# Patient Record
Sex: Male | Born: 1970 | Race: White | Hispanic: No | Smoking: Never smoker
Health system: Southern US, Community
[De-identification: ages and names within clinical notes are randomized; demographics above are authoritative.]

## PROBLEM LIST (undated history)

## (undated) DIAGNOSIS — N200 Calculus of kidney: Secondary | ICD-10-CM

## (undated) DIAGNOSIS — Z9889 Other specified postprocedural states: Secondary | ICD-10-CM

## (undated) HISTORY — PX: CHOLECYSTECTOMY: SHX55

## (undated) HISTORY — DX: Other specified postprocedural states: Z98.890

## (undated) HISTORY — PX: LITHOTRIPSY: SUR834

---

## 2004-05-09 ENCOUNTER — Emergency Department (HOSPITAL_COMMUNITY): Admission: EM | Admit: 2004-05-09 | Discharge: 2004-05-09 | Payer: Self-pay | Admitting: Emergency Medicine

## 2007-11-24 ENCOUNTER — Encounter: Admission: RE | Admit: 2007-11-24 | Discharge: 2007-11-24 | Payer: Self-pay | Admitting: Family Medicine

## 2007-11-25 ENCOUNTER — Emergency Department (HOSPITAL_COMMUNITY): Admission: EM | Admit: 2007-11-25 | Discharge: 2007-11-25 | Payer: Self-pay | Admitting: Emergency Medicine

## 2010-07-03 ENCOUNTER — Emergency Department (HOSPITAL_BASED_OUTPATIENT_CLINIC_OR_DEPARTMENT_OTHER)
Admission: EM | Admit: 2010-07-03 | Discharge: 2010-07-03 | Payer: Self-pay | Source: Home / Self Care | Admitting: Emergency Medicine

## 2010-07-04 ENCOUNTER — Emergency Department (HOSPITAL_BASED_OUTPATIENT_CLINIC_OR_DEPARTMENT_OTHER)
Admission: EM | Admit: 2010-07-04 | Discharge: 2010-07-04 | Payer: Self-pay | Source: Home / Self Care | Admitting: Emergency Medicine

## 2010-10-08 LAB — BASIC METABOLIC PANEL
BUN: 12 mg/dL (ref 6–23)
BUN: 14 mg/dL (ref 6–23)
CO2: 24 mEq/L (ref 19–32)
CO2: 25 mEq/L (ref 19–32)
Calcium: 9 mg/dL (ref 8.4–10.5)
Calcium: 9.3 mg/dL (ref 8.4–10.5)
Chloride: 105 mEq/L (ref 96–112)
Chloride: 107 mEq/L (ref 96–112)
Creatinine, Ser: 0.9 mg/dL (ref 0.4–1.5)
Creatinine, Ser: 1.1 mg/dL (ref 0.4–1.5)
GFR calc Af Amer: >60 mL/min (ref >60)
GFR calc Af Amer: >60 mL/min (ref >60)
GFR calc non Af Amer: >60 mL/min (ref >60)
GFR calc non Af Amer: >60 mL/min (ref >60)
Glucose, Bld: 117 mg/dL — ABNORMAL HIGH (ref 70–99)
Glucose, Bld: 146 mg/dL — ABNORMAL HIGH (ref 70–99)
Potassium: 3.7 mEq/L (ref 3.5–5.1)
Potassium: 3.9 mEq/L (ref 3.5–5.1)
Sodium: 144 mEq/L (ref 135–145)
Sodium: 146 mEq/L — ABNORMAL HIGH (ref 135–145)

## 2010-10-08 LAB — URINE MICROSCOPIC-ADD ON

## 2010-10-08 LAB — URINALYSIS, ROUTINE W REFLEX MICROSCOPIC
Bilirubin Urine: NEGATIVE
Bilirubin Urine: NEGATIVE
Glucose, UA: NEGATIVE mg/dL
Glucose, UA: NEGATIVE mg/dL
Ketones, ur: 15 mg/dL — AB
Ketones, ur: NEGATIVE mg/dL
Leukocytes, UA: NEGATIVE
Nitrite: NEGATIVE
Nitrite: NEGATIVE
Protein, ur: 30 mg/dL — AB
Protein, ur: 30 mg/dL — AB
Specific Gravity, Urine: 1.027 (ref 1.005–1.030)
Specific Gravity, Urine: 1.028 (ref 1.005–1.030)
Urobilinogen, UA: 0.2 mg/dL (ref 0.0–1.0)
Urobilinogen, UA: 0.2 mg/dL (ref 0.0–1.0)
pH: 5.5 (ref 5.0–8.0)
pH: 6 (ref 5.0–8.0)

## 2010-10-08 LAB — CBC
HCT: 44.2 % (ref 39.0–52.0)
HCT: 45.1 % (ref 39.0–52.0)
Hemoglobin: 15.7 g/dL (ref 13.0–17.0)
Hemoglobin: 15.9 g/dL (ref 13.0–17.0)
MCH: 31 pg (ref 26.0–34.0)
MCH: 31.3 pg (ref 26.0–34.0)
MCHC: 34.7 g/dL (ref 30.0–36.0)
MCHC: 36 g/dL (ref 30.0–36.0)
MCV: 86.1 fL (ref 78.0–100.0)
MCV: 90.1 fL (ref 78.0–100.0)
Platelets: 296 10*3/uL (ref 150–400)
Platelets: 320 10*3/uL (ref 150–400)
RBC: 5 MIL/uL (ref 4.22–5.81)
RBC: 5.14 MIL/uL (ref 4.22–5.81)
RDW: 11.7 % (ref 11.5–15.5)
RDW: 11.8 % (ref 11.5–15.5)
WBC: 10.7 10*3/uL — ABNORMAL HIGH (ref 4.0–10.5)
WBC: 9.4 10*3/uL (ref 4.0–10.5)

## 2010-10-08 LAB — DIFFERENTIAL
Basophils Absolute: 0.1 10*3/uL (ref 0.0–0.1)
Basophils Relative: 1 % (ref 0–1)
Eosinophils Absolute: 0.1 10*3/uL (ref 0.0–0.7)
Eosinophils Relative: 1 % (ref 0–5)
Lymphocytes Relative: 33 % (ref 12–46)
Lymphs Abs: 3.5 10*3/uL (ref 0.7–4.0)
Monocytes Absolute: 1 10*3/uL (ref 0.1–1.0)
Monocytes Relative: 9 % (ref 3–12)
Neutro Abs: 6 10*3/uL (ref 1.7–7.7)
Neutrophils Relative %: 57 % (ref 43–77)

## 2011-04-22 LAB — COMPREHENSIVE METABOLIC PANEL
ALT: 212 — ABNORMAL HIGH
AST: 390 — ABNORMAL HIGH
Albumin: 4.2
Alkaline Phosphatase: 77
BUN: 6
CO2: 29
Calcium: 9.9
Chloride: 104
Creatinine, Ser: 0.94
GFR calc Af Amer: >60
GFR calc non Af Amer: >60
Glucose, Bld: 127 — ABNORMAL HIGH
Potassium: 4.3
Sodium: 139
Total Bilirubin: 2 — ABNORMAL HIGH
Total Protein: 7.1

## 2011-04-22 LAB — DIFFERENTIAL
Basophils Absolute: 0
Basophils Relative: 0
Eosinophils Absolute: 0
Eosinophils Relative: 0
Lymphocytes Relative: 8 — ABNORMAL LOW
Lymphs Abs: 1
Monocytes Absolute: 0.8
Monocytes Relative: 6
Neutro Abs: 11.6 — ABNORMAL HIGH
Neutrophils Relative %: 86 — ABNORMAL HIGH

## 2011-04-22 LAB — CBC
HCT: 47.5
Hemoglobin: 16.7
MCHC: 35.2
MCV: 87.8
Platelets: 309
RBC: 5.41
RDW: 13
WBC: 13.5 — ABNORMAL HIGH

## 2011-04-22 LAB — AMYLASE: Amylase: 85

## 2011-04-22 LAB — LIPASE, BLOOD: Lipase: 52

## 2012-02-22 ENCOUNTER — Encounter (HOSPITAL_BASED_OUTPATIENT_CLINIC_OR_DEPARTMENT_OTHER): Payer: Self-pay

## 2012-02-22 ENCOUNTER — Emergency Department (HOSPITAL_BASED_OUTPATIENT_CLINIC_OR_DEPARTMENT_OTHER)
Admission: EM | Admit: 2012-02-22 | Discharge: 2012-02-22 | Disposition: A | Discharge status: Home / Self Care | Payer: BC Managed Care – PPO | Attending: Emergency Medicine | Admitting: Emergency Medicine

## 2012-02-22 ENCOUNTER — Emergency Department (HOSPITAL_BASED_OUTPATIENT_CLINIC_OR_DEPARTMENT_OTHER): Payer: BC Managed Care – PPO

## 2012-02-22 DIAGNOSIS — N200 Calculus of kidney: Secondary | ICD-10-CM | POA: Insufficient documentation

## 2012-02-22 DIAGNOSIS — N289 Disorder of kidney and ureter, unspecified: Secondary | ICD-10-CM

## 2012-02-22 HISTORY — DX: Calculus of kidney: N20.0

## 2012-02-22 LAB — BASIC METABOLIC PANEL
BUN: 15 mg/dL (ref 6–23)
CO2: 27 mEq/L (ref 19–32)
Calcium: 9 mg/dL (ref 8.4–10.5)
Chloride: 103 mEq/L (ref 96–112)
Creatinine, Ser: 1.2 mg/dL (ref 0.50–1.35)
GFR calc Af Amer: 86 mL/min — ABNORMAL LOW (ref >90)
GFR calc non Af Amer: 74 mL/min — ABNORMAL LOW (ref >90)
Glucose, Bld: 187 mg/dL — ABNORMAL HIGH (ref 70–99)
Potassium: 3.7 mEq/L (ref 3.5–5.1)
Sodium: 140 mEq/L (ref 135–145)

## 2012-02-22 LAB — CBC WITH DIFFERENTIAL/PLATELET
Basophils Absolute: 0.1 10*3/uL (ref 0.0–0.1)
Basophils Relative: 1 % (ref 0–1)
Eosinophils Absolute: 0.3 10*3/uL (ref 0.0–0.7)
Eosinophils Relative: 3 % (ref 0–5)
HCT: 43.4 % (ref 39.0–52.0)
Hemoglobin: 15.7 g/dL (ref 13.0–17.0)
Lymphocytes Relative: 44 % (ref 12–46)
Lymphs Abs: 5.1 10*3/uL — ABNORMAL HIGH (ref 0.7–4.0)
MCH: 30.7 pg (ref 26.0–34.0)
MCHC: 36.2 g/dL — ABNORMAL HIGH (ref 30.0–36.0)
MCV: 84.8 fL (ref 78.0–100.0)
Monocytes Absolute: 1.2 10*3/uL — ABNORMAL HIGH (ref 0.1–1.0)
Monocytes Relative: 10 % (ref 3–12)
Neutro Abs: 5 10*3/uL (ref 1.7–7.7)
Neutrophils Relative %: 43 % (ref 43–77)
Platelets: 311 10*3/uL (ref 150–400)
RBC: 5.12 MIL/uL (ref 4.22–5.81)
RDW: 12.2 % (ref 11.5–15.5)
WBC: 11.6 10*3/uL — ABNORMAL HIGH (ref 4.0–10.5)

## 2012-02-22 LAB — URINALYSIS, ROUTINE W REFLEX MICROSCOPIC
Bilirubin Urine: NEGATIVE
Glucose, UA: NEGATIVE mg/dL
Ketones, ur: NEGATIVE mg/dL
Leukocytes, UA: NEGATIVE
Nitrite: NEGATIVE
Protein, ur: NEGATIVE mg/dL
Specific Gravity, Urine: 1.024 (ref 1.005–1.030)
Urobilinogen, UA: 0.2 mg/dL (ref 0.0–1.0)
pH: 5.5 (ref 5.0–8.0)

## 2012-02-22 LAB — URINE MICROSCOPIC-ADD ON

## 2012-02-22 MED ORDER — OXYCODONE-ACETAMINOPHEN 5-325 MG PO TABS: 1.0000 | ORAL_TABLET | Freq: Four times a day (QID) | ORAL | Status: AC | PRN

## 2012-02-22 MED ORDER — TAMSULOSIN HCL 0.4 MG PO CAPS: 0.4000 mg | ORAL_CAPSULE | Freq: Every day | ORAL | Status: DC

## 2012-02-22 MED ORDER — KETOROLAC TROMETHAMINE 30 MG/ML IJ SOLN
30.0000 mg | Freq: Once | INTRAMUSCULAR | Status: AC
  Administered 2012-02-22: 30 mg via INTRAVENOUS
  Filled 2012-02-22: qty 1

## 2012-02-22 MED ORDER — FENTANYL CITRATE 0.05 MG/ML IJ SOLN
50.0000 ug | Freq: Once | INTRAMUSCULAR | Status: AC
  Administered 2012-02-22: 50 ug via INTRAVENOUS
  Filled 2012-02-22: qty 2

## 2012-02-22 MED ORDER — ONDANSETRON HCL 4 MG/2ML IJ SOLN
4.0000 mg | Freq: Once | INTRAMUSCULAR | Status: AC
Start: 2012-02-22 — End: 2012-02-22
  Administered 2012-02-22: 4 mg via INTRAVENOUS
  Filled 2012-02-22: qty 2

## 2012-02-22 NOTE — ED Notes (Signed)
Patient here with acute onset of right flank pain at 0300, nausea with pain.  Hx of same

## 2012-02-22 NOTE — ED Provider Notes (Signed)
History     CSN: [id number]  Arrival date & time 02/22/12  0504   First MD Initiated Contact with Patient 02/22/12 0510      Chief Complaint  Patient presents with  . Flank Pain    (Consider location/radiation/quality/duration/timing/severity/associated sxs/prior treatment) Patient is a 41 y.o. male presenting with flank pain. The history is provided by the patient.  Flank Pain This is a recurrent problem. The current episode started less than 1 hour ago. The problem occurs constantly. The problem has not changed since onset.Pertinent negatives include no chest pain, no abdominal pain, no headaches and no shortness of breath. Nothing aggravates the symptoms. Nothing relieves the symptoms. He has tried nothing for the symptoms. The treatment provided no relief.    Past Medical History  Diagnosis Date  . Kidney stones     History reviewed. No pertinent past surgical history.  No family history on file.  History  Substance Use Topics  . Smoking status: Never Smoker   . Smokeless tobacco: Not on file  . Alcohol Use: No      Review of Systems  Respiratory: Negative for shortness of breath.   Cardiovascular: Negative for chest pain.  Gastrointestinal: Negative for abdominal pain.  Genitourinary: Positive for flank pain. Negative for hematuria.  Neurological: Negative for headaches.  All other systems reviewed and are negative.    Allergies  Penicillins  Home Medications  No current outpatient prescriptions on file.  BP 140/79  Pulse 52  Temp 97.9 F (36.6 C)  Resp 20  SpO2 100%  Physical Exam  Constitutional: He is oriented to person, place, and time. He appears well-developed and well-nourished. No distress.  HENT:  Head: Normocephalic and atraumatic.  Mouth/Throat: Oropharynx is clear and moist.  Eyes: Conjunctivae are normal. Pupils are equal, round, and reactive to light.  Neck: Normal range of motion. Neck supple.  Cardiovascular: Normal rate and  regular rhythm.   Pulmonary/Chest: Effort normal and breath sounds normal. He has no wheezes.  Abdominal: Soft. Bowel sounds are normal. There is no tenderness. There is no rebound and no guarding.  Musculoskeletal: Normal range of motion.  Neurological: He is alert and oriented to person, place, and time.  Skin: Skin is warm and dry.  Psychiatric: He has a normal mood and affect.    ED Course  Procedures (including critical care time)  Labs Reviewed  URINALYSIS, ROUTINE W REFLEX MICROSCOPIC - Abnormal; Notable for the following:    Hgb urine dipstick LARGE (*)     All other components within normal limits  CBC WITH DIFFERENTIAL - Abnormal; Notable for the following:    WBC 11.6 (*)     MCHC 36.2 (*)     Lymphs Abs 5.1 (*)     Monocytes Absolute 1.2 (*)     All other components within normal limits  URINE MICROSCOPIC-ADD ON - Abnormal; Notable for the following:    Bacteria, UA FEW (*)     All other components within normal limits  BASIC METABOLIC PANEL   No results found.   No diagnosis found.    MDM  Follow up with Dr. Lindley Magnus in 7 days, strain all urine.  Return for worsening symptoms.  Patient informed of renal lesion seen on CT scan and need for follow up outpatient ultrasound of the kidney to better characterize this lesion.  Please inform Dr. Lindley Magnus.  Patient verbalizes understanding and agrees to follow up       Eura Radabaugh Smitty Cords, MD 02/22/12 [phone]

## 2012-02-22 NOTE — ED Notes (Signed)
Awaiting ride to home.

## 2012-04-27 ENCOUNTER — Ambulatory Visit (INDEPENDENT_AMBULATORY_CARE_PROVIDER_SITE_OTHER): Payer: BC Managed Care – PPO | Admitting: Licensed Clinical Social Worker

## 2012-04-27 DIAGNOSIS — F39 Unspecified mood [affective] disorder: Secondary | ICD-10-CM

## 2012-05-06 ENCOUNTER — Ambulatory Visit: Payer: BC Managed Care – PPO | Admitting: Licensed Clinical Social Worker

## 2012-05-12 ENCOUNTER — Ambulatory Visit (INDEPENDENT_AMBULATORY_CARE_PROVIDER_SITE_OTHER): Payer: BC Managed Care – PPO | Admitting: Licensed Clinical Social Worker

## 2012-05-12 DIAGNOSIS — F39 Unspecified mood [affective] disorder: Secondary | ICD-10-CM

## 2012-05-20 ENCOUNTER — Ambulatory Visit (INDEPENDENT_AMBULATORY_CARE_PROVIDER_SITE_OTHER): Payer: BC Managed Care – PPO | Admitting: Licensed Clinical Social Worker

## 2012-05-20 DIAGNOSIS — F39 Unspecified mood [affective] disorder: Secondary | ICD-10-CM

## 2012-05-25 ENCOUNTER — Ambulatory Visit (INDEPENDENT_AMBULATORY_CARE_PROVIDER_SITE_OTHER): Payer: BC Managed Care – PPO | Admitting: Licensed Clinical Social Worker

## 2012-05-25 DIAGNOSIS — F39 Unspecified mood [affective] disorder: Secondary | ICD-10-CM

## 2012-06-03 ENCOUNTER — Ambulatory Visit: Payer: BC Managed Care – PPO | Admitting: Licensed Clinical Social Worker

## 2012-06-15 ENCOUNTER — Ambulatory Visit (INDEPENDENT_AMBULATORY_CARE_PROVIDER_SITE_OTHER): Payer: BC Managed Care – PPO | Admitting: Licensed Clinical Social Worker

## 2012-06-15 DIAGNOSIS — F39 Unspecified mood [affective] disorder: Secondary | ICD-10-CM

## 2012-06-29 ENCOUNTER — Ambulatory Visit (INDEPENDENT_AMBULATORY_CARE_PROVIDER_SITE_OTHER): Payer: BC Managed Care – PPO | Admitting: Licensed Clinical Social Worker

## 2012-06-29 DIAGNOSIS — F39 Unspecified mood [affective] disorder: Secondary | ICD-10-CM

## 2012-07-13 ENCOUNTER — Ambulatory Visit: Payer: BC Managed Care – PPO | Admitting: Licensed Clinical Social Worker

## 2012-07-15 ENCOUNTER — Ambulatory Visit (INDEPENDENT_AMBULATORY_CARE_PROVIDER_SITE_OTHER): Payer: BC Managed Care – PPO | Admitting: Licensed Clinical Social Worker

## 2012-07-15 DIAGNOSIS — F39 Unspecified mood [affective] disorder: Secondary | ICD-10-CM

## 2012-08-05 ENCOUNTER — Ambulatory Visit (INDEPENDENT_AMBULATORY_CARE_PROVIDER_SITE_OTHER): Payer: BC Managed Care – PPO | Admitting: Licensed Clinical Social Worker

## 2012-08-05 DIAGNOSIS — F39 Unspecified mood [affective] disorder: Secondary | ICD-10-CM

## 2012-08-17 ENCOUNTER — Ambulatory Visit (INDEPENDENT_AMBULATORY_CARE_PROVIDER_SITE_OTHER): Payer: BC Managed Care – PPO | Admitting: Licensed Clinical Social Worker

## 2012-08-17 DIAGNOSIS — F39 Unspecified mood [affective] disorder: Secondary | ICD-10-CM

## 2012-09-02 ENCOUNTER — Ambulatory Visit (INDEPENDENT_AMBULATORY_CARE_PROVIDER_SITE_OTHER): Payer: BC Managed Care – PPO | Admitting: Licensed Clinical Social Worker

## 2012-09-02 DIAGNOSIS — F39 Unspecified mood [affective] disorder: Secondary | ICD-10-CM

## 2012-09-16 ENCOUNTER — Ambulatory Visit: Payer: BC Managed Care – PPO | Admitting: Licensed Clinical Social Worker

## 2012-09-21 ENCOUNTER — Ambulatory Visit (INDEPENDENT_AMBULATORY_CARE_PROVIDER_SITE_OTHER): Payer: BC Managed Care – PPO | Admitting: Licensed Clinical Social Worker

## 2012-09-21 DIAGNOSIS — F39 Unspecified mood [affective] disorder: Secondary | ICD-10-CM

## 2012-09-30 ENCOUNTER — Ambulatory Visit (INDEPENDENT_AMBULATORY_CARE_PROVIDER_SITE_OTHER): Payer: BC Managed Care – PPO | Admitting: Licensed Clinical Social Worker

## 2012-09-30 DIAGNOSIS — F39 Unspecified mood [affective] disorder: Secondary | ICD-10-CM

## 2012-10-14 ENCOUNTER — Ambulatory Visit (INDEPENDENT_AMBULATORY_CARE_PROVIDER_SITE_OTHER): Payer: BC Managed Care – PPO | Admitting: Licensed Clinical Social Worker

## 2012-10-14 DIAGNOSIS — F39 Unspecified mood [affective] disorder: Secondary | ICD-10-CM

## 2012-10-26 ENCOUNTER — Ambulatory Visit (INDEPENDENT_AMBULATORY_CARE_PROVIDER_SITE_OTHER): Payer: BC Managed Care – PPO | Admitting: Licensed Clinical Social Worker

## 2012-10-26 DIAGNOSIS — F39 Unspecified mood [affective] disorder: Secondary | ICD-10-CM

## 2012-11-16 ENCOUNTER — Ambulatory Visit: Payer: BC Managed Care – PPO | Admitting: Licensed Clinical Social Worker

## 2012-12-02 ENCOUNTER — Ambulatory Visit: Payer: BC Managed Care – PPO | Admitting: Licensed Clinical Social Worker

## 2012-12-09 ENCOUNTER — Ambulatory Visit (INDEPENDENT_AMBULATORY_CARE_PROVIDER_SITE_OTHER): Payer: BC Managed Care – PPO | Admitting: Licensed Clinical Social Worker

## 2012-12-09 DIAGNOSIS — F39 Unspecified mood [affective] disorder: Secondary | ICD-10-CM

## 2012-12-23 ENCOUNTER — Ambulatory Visit: Payer: BC Managed Care – PPO | Admitting: Licensed Clinical Social Worker

## 2019-09-02 ENCOUNTER — Other Ambulatory Visit: Payer: Self-pay

## 2019-09-02 ENCOUNTER — Emergency Department (HOSPITAL_BASED_OUTPATIENT_CLINIC_OR_DEPARTMENT_OTHER)
Admission: EM | Admit: 2019-09-02 | Discharge: 2019-09-02 | Disposition: A | Discharge status: Home / Self Care | Payer: BC Managed Care – PPO | Attending: Emergency Medicine | Admitting: Emergency Medicine

## 2019-09-02 ENCOUNTER — Emergency Department (HOSPITAL_BASED_OUTPATIENT_CLINIC_OR_DEPARTMENT_OTHER): Payer: BC Managed Care – PPO

## 2019-09-02 ENCOUNTER — Encounter (HOSPITAL_BASED_OUTPATIENT_CLINIC_OR_DEPARTMENT_OTHER): Payer: Self-pay | Admitting: Emergency Medicine

## 2019-09-02 DIAGNOSIS — Z79899 Other long term (current) drug therapy: Secondary | ICD-10-CM | POA: Diagnosis not present

## 2019-09-02 DIAGNOSIS — N200 Calculus of kidney: Secondary | ICD-10-CM | POA: Diagnosis not present

## 2019-09-02 DIAGNOSIS — N130 Hydronephrosis with ureteropelvic junction obstruction: Secondary | ICD-10-CM | POA: Insufficient documentation

## 2019-09-02 DIAGNOSIS — R1032 Left lower quadrant pain: Secondary | ICD-10-CM | POA: Diagnosis present

## 2019-09-02 DIAGNOSIS — N201 Calculus of ureter: Secondary | ICD-10-CM

## 2019-09-02 LAB — CBC WITH DIFFERENTIAL/PLATELET
Abs Immature Granulocytes: 0.04 10*3/uL (ref 0.00–0.07)
Basophils Absolute: 0.1 10*3/uL (ref 0.0–0.1)
Basophils Relative: 1 %
Eosinophils Absolute: 0.1 10*3/uL (ref 0.0–0.5)
Eosinophils Relative: 1 %
HCT: 49.3 % (ref 39.0–52.0)
Hemoglobin: 16.7 g/dL (ref 13.0–17.0)
Immature Granulocytes: 0 %
Lymphocytes Relative: 33 %
Lymphs Abs: 3.4 10*3/uL (ref 0.7–4.0)
MCH: 30.5 pg (ref 26.0–34.0)
MCHC: 33.9 g/dL (ref 30.0–36.0)
MCV: 90.1 fL (ref 80.0–100.0)
Monocytes Absolute: 1.1 10*3/uL — ABNORMAL HIGH (ref 0.1–1.0)
Monocytes Relative: 11 %
Neutro Abs: 5.4 10*3/uL (ref 1.7–7.7)
Neutrophils Relative %: 54 %
Platelets: 367 10*3/uL (ref 150–400)
RBC: 5.47 MIL/uL (ref 4.22–5.81)
RDW: 11.9 % (ref 11.5–15.5)
WBC: 10.1 10*3/uL (ref 4.0–10.5)
nRBC: 0 % (ref 0.0–0.2)

## 2019-09-02 LAB — BASIC METABOLIC PANEL
Anion gap: 9 (ref 5–15)
BUN: 21 mg/dL — ABNORMAL HIGH (ref 6–20)
CO2: 22 mmol/L (ref 22–32)
Calcium: 9 mg/dL (ref 8.9–10.3)
Chloride: 107 mmol/L (ref 98–111)
Creatinine, Ser: 1.19 mg/dL (ref 0.61–1.24)
GFR calc Af Amer: >60 mL/min (ref >60)
GFR calc non Af Amer: >60 mL/min (ref >60)
Glucose, Bld: 148 mg/dL — ABNORMAL HIGH (ref 70–99)
Potassium: 3.9 mmol/L (ref 3.5–5.1)
Sodium: 138 mmol/L (ref 135–145)

## 2019-09-02 LAB — URINALYSIS, ROUTINE W REFLEX MICROSCOPIC
Glucose, UA: NEGATIVE mg/dL
Ketones, ur: NEGATIVE mg/dL
Leukocytes,Ua: NEGATIVE
Nitrite: NEGATIVE
Protein, ur: 30 mg/dL — AB
Specific Gravity, Urine: >1.03 — ABNORMAL HIGH (ref 1.005–1.030)
pH: 6 (ref 5.0–8.0)

## 2019-09-02 LAB — URINALYSIS, MICROSCOPIC (REFLEX): RBC / HPF: >50 RBC/hpf (ref 0–5)

## 2019-09-02 MED ORDER — ONDANSETRON HCL 4 MG/2ML IJ SOLN
4.0000 mg | Freq: Once | INTRAMUSCULAR | Status: AC
  Administered 2019-09-02: 4 mg via INTRAVENOUS
  Filled 2019-09-02: qty 2

## 2019-09-02 MED ORDER — SODIUM CHLORIDE 0.9 % IV BOLUS
500.0000 mL | Freq: Once | INTRAVENOUS | Status: AC
  Administered 2019-09-02: 11:00:00 500 mL via INTRAVENOUS

## 2019-09-02 MED ORDER — HYDROMORPHONE HCL 1 MG/ML IJ SOLN
1.0000 mg | Freq: Once | INTRAMUSCULAR | Status: AC
  Administered 2019-09-02: 1 mg via INTRAVENOUS
  Filled 2019-09-02: qty 1

## 2019-09-02 NOTE — ED Provider Notes (Signed)
[facility] EMERGENCY DEPARTMENT Provider Note   CSN: [id number] Arrival date & time: 09/02/19  1016     History Chief Complaint  Patient presents with   Flank Pain    Louis Booker is a 49 y.o. male.  49 year old male with past medical history of kidney stones presents with complaint of left flank pain onset this morning after waking.  Pain is constant, sharp in nature, located left flank, does not radiate.  Nothing makes pain worse, pain somewhat improved after taking ibuprofen and a hot bath.  Associated with nausea, no vomiting.  Denies fevers, changes in bowel habits, states his urine is dark today.  Patient has a history of prior kidney stones, has had lithotripsy in the past, sees Dr. Sabino Gasser with urology.  No other complaints or concerns today.        Past Medical History:  Diagnosis Date   Kidney stones     There are no problems to display for this patient.   Past Surgical History:  Procedure Laterality Date   CHOLECYSTECTOMY         No family history on file.  Social History   Tobacco Use   Smoking status: Never Smoker   Smokeless tobacco: Never Used  Substance Use Topics   Alcohol use: Yes    Comment: occasionally   Drug use: Never    Home Medications Prior to Admission medications   Medication Sig Start Date End Date Taking? Authorizing Provider  Tamsulosin HCl (FLOMAX) 0.4 MG CAPS Take 1 capsule (0.4 mg total) by mouth daily. 02/22/12   Palumbo, April, MD    Allergies    Penicillins  Review of Systems   Review of Systems  Constitutional: Negative for chills and fever.  Respiratory: Negative for shortness of breath.   Cardiovascular: Negative for chest pain.  Gastrointestinal: Positive for nausea. Negative for abdominal pain, constipation, diarrhea and vomiting.  Genitourinary: Positive for flank pain. Negative for dysuria.  Musculoskeletal: Negative for arthralgias and myalgias.  Skin: Negative for rash and wound.    Allergic/Immunologic: Negative for immunocompromised state.  Neurological: Negative for weakness.  Hematological: Does not bruise/bleed easily.  Psychiatric/Behavioral: Negative for confusion.  All other systems reviewed and are negative.   Physical Exam Updated Vital Signs BP 137/83 (BP Location: Right Arm)    Pulse (!) 58    Temp 97.8 F (36.6 C) (Oral)    Resp 18    Ht 6\' 4"  (1.93 m)    Wt 99.8 kg    SpO2 100%    BMI 26.78 kg/m   Physical Exam Vitals and nursing note reviewed.  Constitutional:      General: He is not in acute distress.    Appearance: He is well-developed. He is not diaphoretic.     Comments: Appears uncomfortable  HENT:     Head: Normocephalic and atraumatic.  Cardiovascular:     Rate and Rhythm: Normal rate and regular rhythm.     Pulses: Normal pulses.     Heart sounds: Normal heart sounds.  Pulmonary:     Effort: Pulmonary effort is normal.     Breath sounds: Normal breath sounds.  Abdominal:     Palpations: Abdomen is soft.     Tenderness: There is no abdominal tenderness.  Musculoskeletal:     Right lower leg: No edema.     Left lower leg: No edema.  Skin:    General: Skin is warm and dry.     Coloration: Skin is pale.  Findings: No erythema or rash.  Neurological:     Mental Status: He is alert and oriented to person, place, and time.  Psychiatric:        Behavior: Behavior normal.     ED Results / Procedures / Treatments   Labs (all labs ordered are listed, but only abnormal results are displayed) Labs Reviewed  URINALYSIS, ROUTINE W REFLEX MICROSCOPIC - Abnormal; Notable for the following components:      Result Value   Color, Urine AMBER (*)    APPearance CLOUDY (*)    Specific Gravity, Urine >1.030 (*)    Hgb urine dipstick LARGE (*)    Bilirubin Urine SMALL (*)    Protein, ur 30 (*)    All other components within normal limits  BASIC METABOLIC PANEL - Abnormal; Notable for the following components:   Glucose, Bld 148 (*)     BUN 21 (*)    All other components within normal limits  CBC WITH DIFFERENTIAL/PLATELET - Abnormal; Notable for the following components:   Monocytes Absolute 1.1 (*)    All other components within normal limits  URINALYSIS, MICROSCOPIC (REFLEX) - Abnormal; Notable for the following components:   Bacteria, UA MANY (*)    All other components within normal limits    EKG None  Radiology CT Renal Stone Study  Result Date: 09/02/2019 CLINICAL DATA:  Flank pain kidney stone suspected EXAM: CT ABDOMEN AND PELVIS WITHOUT CONTRAST TECHNIQUE: Multidetector CT imaging of the abdomen and pelvis was performed following the standard protocol without IV contrast. COMPARISON:  03/25/2016 FINDINGS: Lower chest: Incidental imaging of the lung bases is unremarkable. No signs of pleural effusion. No evidence of pericardial fluid. Hepatobiliary: No signs of focal hepatic lesion. Post cholecystectomy. Pancreas: Pancreas is normal without signs of peripancreatic inflammation. Spleen: Spleen is normal size without visible lesion on noncontrast imaging. Adrenals/Urinary Tract: Adrenal glands are normal. Signs of mild-to-moderate left-sided hydroureteronephrosis with left UPJ/proximal ureteral calculus measuring 5 x 6 mm. Mild Peri ureteral stranding. No additional calculus in the left ureter. Punctate nonobstructing calculus in the interpolar left kidney 2 mm. Nonobstructing intrarenal calculus upper pole right kidney measuring 2 mm. Small low-density lesion arising from posterior right kidney, 27 Hounsfield units measuring 11 mm previously 11 mm in 2017. Characterized as a small cyst on previous renal/abdominal sonogram. Stomach/Bowel: Appendix is normal. No signs of bowel obstruction or acute bowel process. Stool fills the ascending and transverse colon. Vascular/Lymphatic: Minimal calcified atherosclerotic change in the abdominal aorta. No signs of aneurysmal dilation. No signs of adenopathy in the upper abdomen or in  the retroperitoneum. No signs of pelvic lymphadenopathy. Reproductive: Prostate calcifications, normal sized prostate. Nonspecific and essentially unremarkable on noncontrast imaging. Unchanged from prior study. Other: Small fat containing umbilical hernia. Musculoskeletal: No acute bone finding or destructive bone process. IMPRESSION: 1. Signs of mild-to-moderate left-sided hydroureteronephrosis with left UPJ/proximal ureteral calculus measuring 5 x 6 mm. 2. Nonobstructing intrarenal calculi bilaterally. 3. Normal appendix and changes of cholecystectomy. Electronically Signed   By: Zetta Bills M.D.   On: 09/02/2019 11:02    Procedures Procedures (including critical care time)  Medications Ordered in ED Medications  sodium chloride 0.9 % bolus 500 mL (500 mLs Intravenous New Bag/Given 09/02/19 1038)  HYDROmorphone (DILAUDID) injection 1 mg (1 mg Intravenous Given 09/02/19 1036)  ondansetron (ZOFRAN) injection 4 mg (4 mg Intravenous Given 09/02/19 1035)    ED Course  I have reviewed the triage vital signs and the nursing notes.  Pertinent labs &  imaging results that were available during my care of the patient were reviewed by me and considered in my medical decision making (see chart for details).  Clinical Course as of Sep 01 1146  Fri Sep 02, 2019  1769 49 year old male with history of kidney stones presents with left flank pain onset this morning.  Vitals reviewed, patient is afebrile, normotensive.  Patient appears uncomfortable, pale, abdomen is soft and nontender. Patient was given IV fluids and pain medication. CT returns with report of 5 x 6 mm proximal stone.  Labs reviewed, CBC normal, urinalysis with hemoglobin and many bacteria, leukocyte and nitrite negative.  Awaiting BMP. Discussed results with patient, feeling better at this time, reports occasional waves of pain.  Plan is to contact patient's urologist, Dr. Sabino Gasser, with BMP results.   [LM]  1147 BMP with normal renal  function. Case discussed with Dr. Sabino Gasser, patient's urologist, plan is to follow-up in the office today at 1:30 PM.  Discussed plan with patient, pain is controlled at this point, verbalizes understanding of discharge instructions and plan.   [LM]    Clinical Course User Index [LM] Alden Hipp   MDM Rules/Calculators/A&P                      Final Clinical Impression(s) / ED Diagnoses Final diagnoses:  Ureterolithiasis    Rx / DC Orders ED Discharge Orders    None       Jeannie Fend, PA-C 09/02/19 1148    Melene Plan, DO 09/02/19 1255

## 2019-09-02 NOTE — ED Notes (Signed)
Left at this time.

## 2019-09-02 NOTE — ED Notes (Signed)
Taken to ct at this time. 

## 2019-09-02 NOTE — ED Triage Notes (Signed)
Reports left flank pain that started this morning.  Hx of kidney stones.  Also reports nausea but no vomiting. Took ibuprofen 600mg  at 0930 with no relief.

## 2019-09-02 NOTE — ED Notes (Signed)
Discharge reviewed at this time.  Waiting in room till ride arrives.

## 2019-09-02 NOTE — Discharge Instructions (Addendum)
Follow up with Dr. Sabino Gasser in the office today at 1:30PM.

## 2020-10-07 IMAGING — CT CT RENAL STONE PROTOCOL
2 of 4 series · 16 of 46 positions shown, 18 images · non-contrast
Comparison: 03/25/2016

CLINICAL DATA: Flank pain kidney stone suspected

EXAM:
CT ABDOMEN AND PELVIS WITHOUT CONTRAST
TECHNIQUE: Multidetector CT imaging of the abdomen and pelvis was performed
following the standard protocol without IV contrast.

[Series 2: axial st · axial · 0.87mm/px · z∈[-473,+2]mm · 13 of 105 slices shown, 15 images]
[im 5/105  soft-tissue]
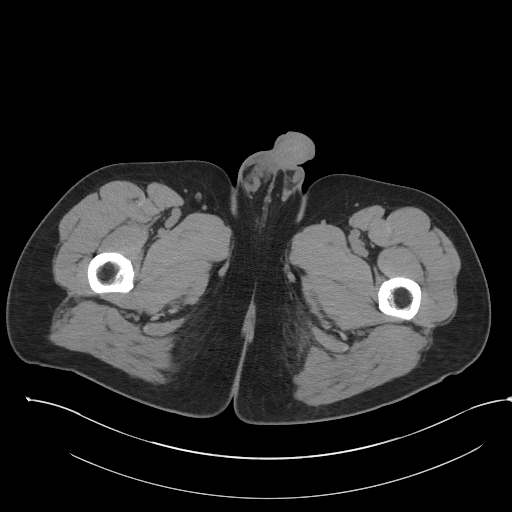
[im 5/105  bone]
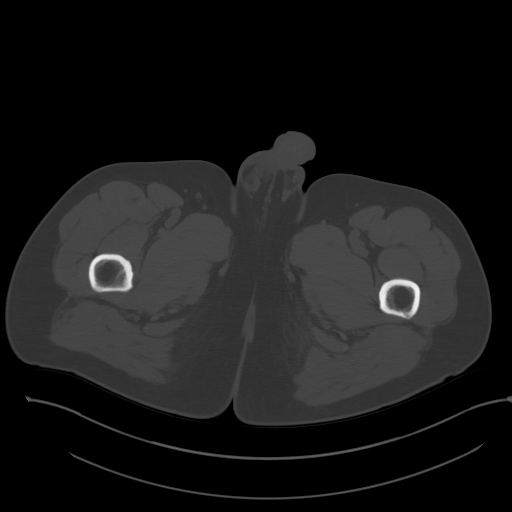
[im 13/105  soft-tissue]
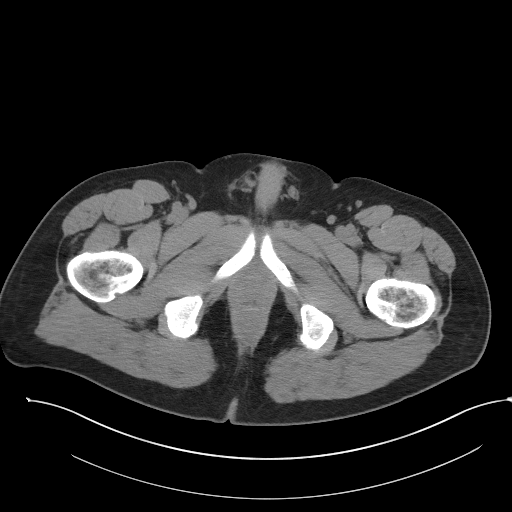
[im 21/105  soft-tissue]
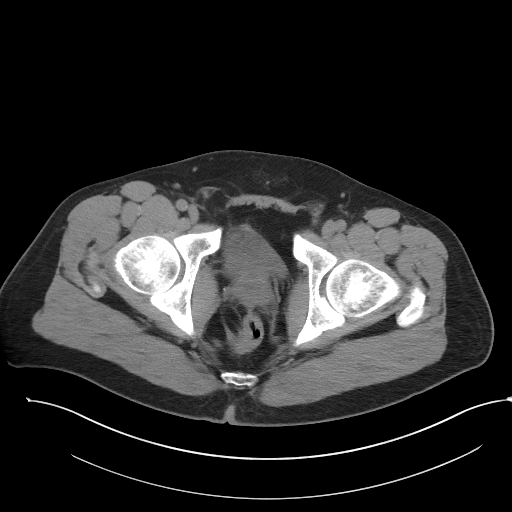
[im 30/105  soft-tissue]
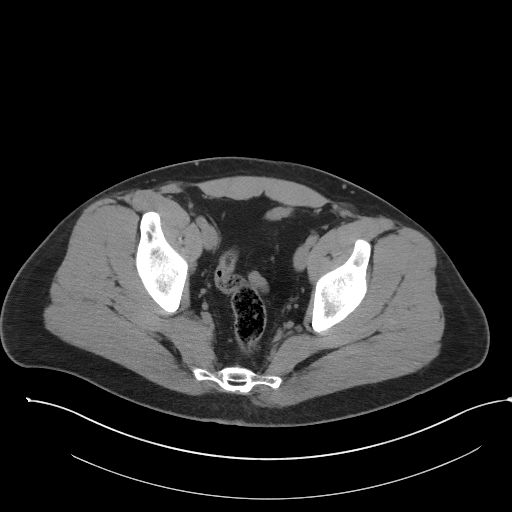
[im 38/105  soft-tissue]
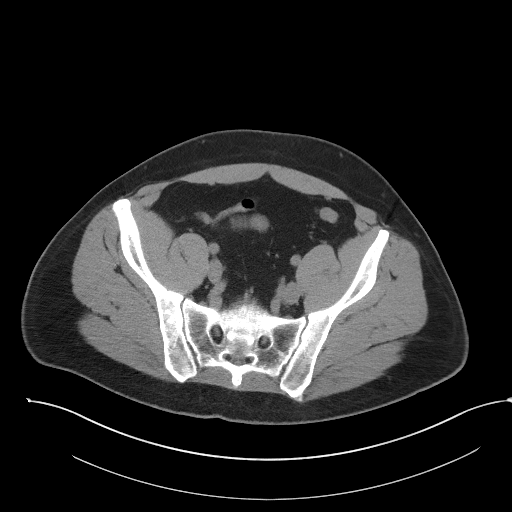
[im 46/105  soft-tissue]
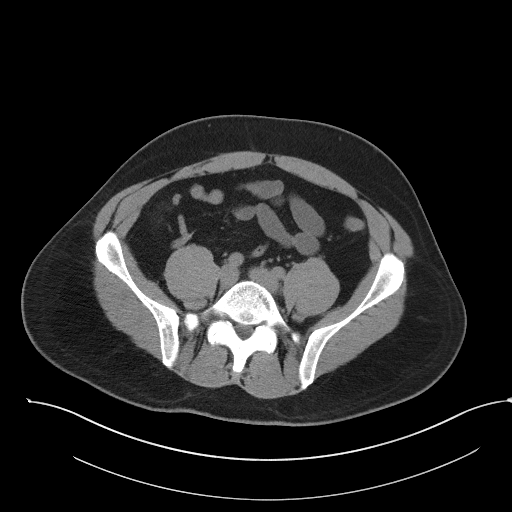
[im 55/105  soft-tissue]
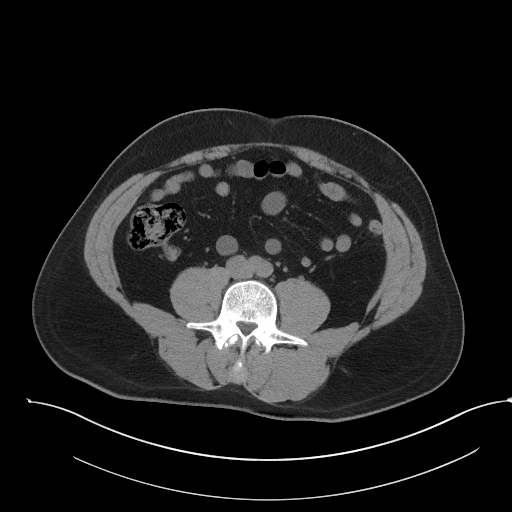
[im 59/105  soft-tissue]
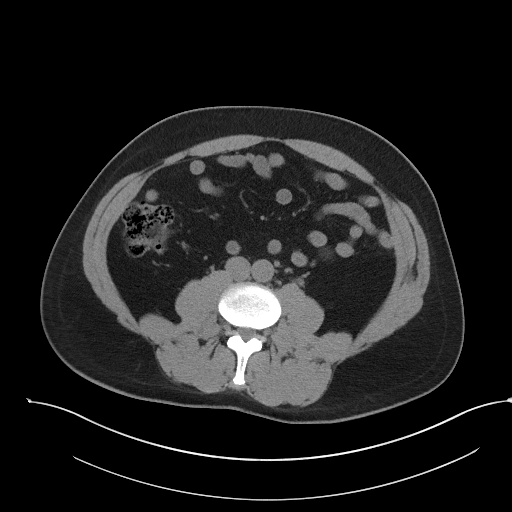
[im 67/105  soft-tissue]
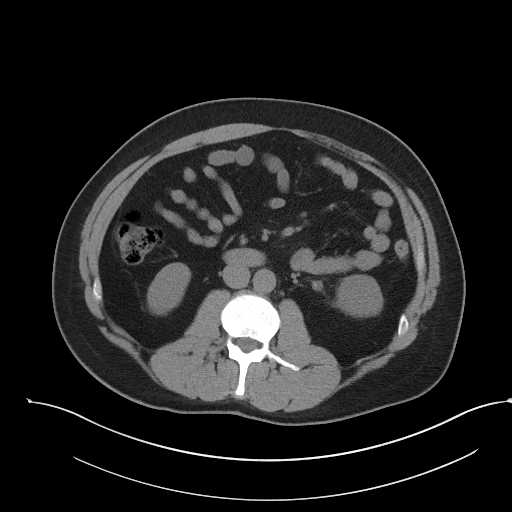
[im 67/105  bone]
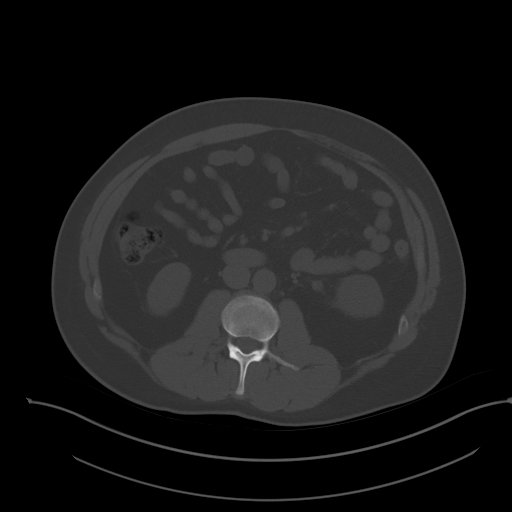
[im 75/105  soft-tissue]
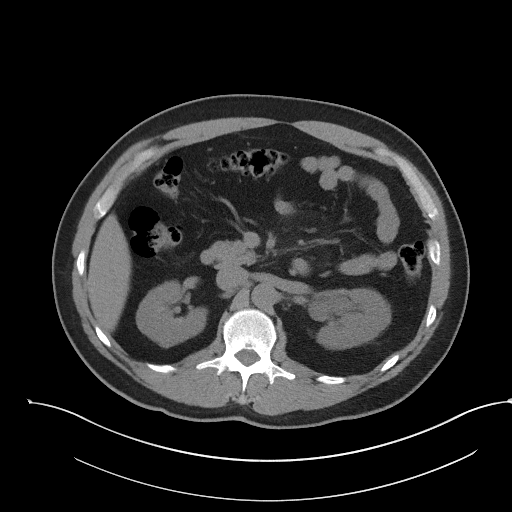
[im 84/105  soft-tissue]
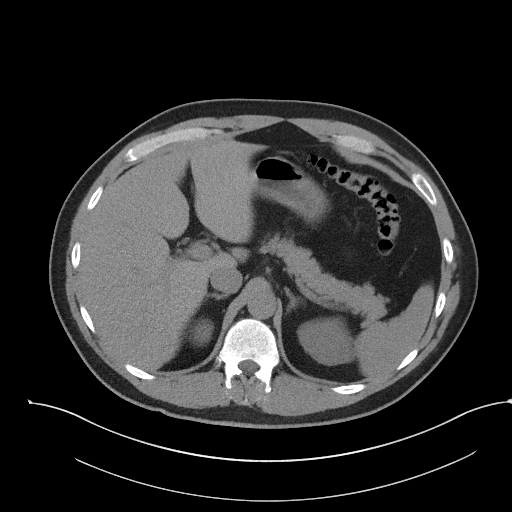
[im 92/105  soft-tissue]
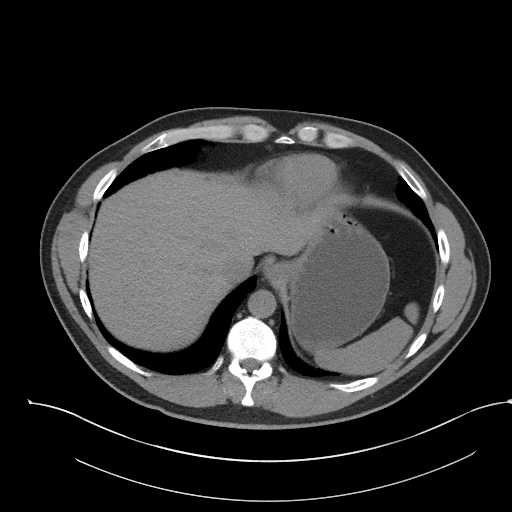
[im 100/105  soft-tissue]
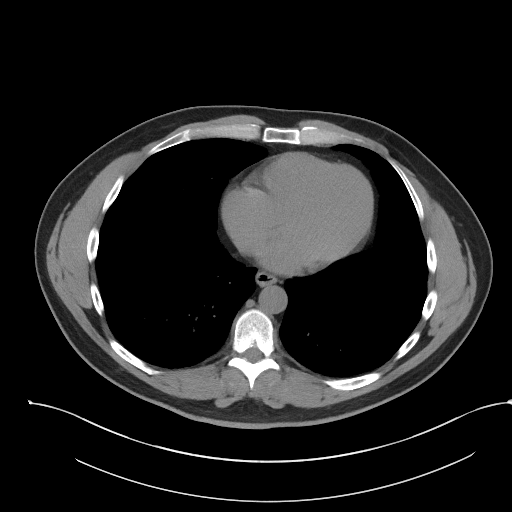

[Series 5: coronal st · coronal · 1.05mm/px · 3 of 101 slices shown]
[im 34/101  soft-tissue]
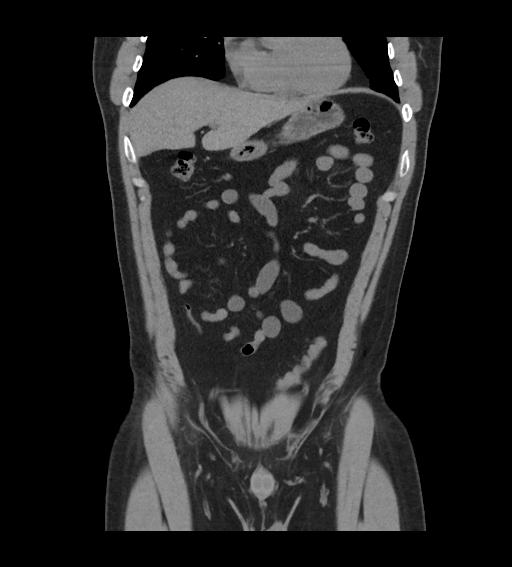
[im 45/101  soft-tissue]
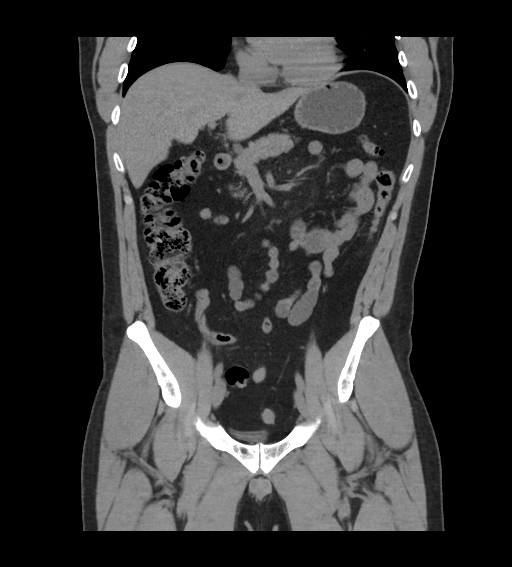
[im 56/101  soft-tissue]
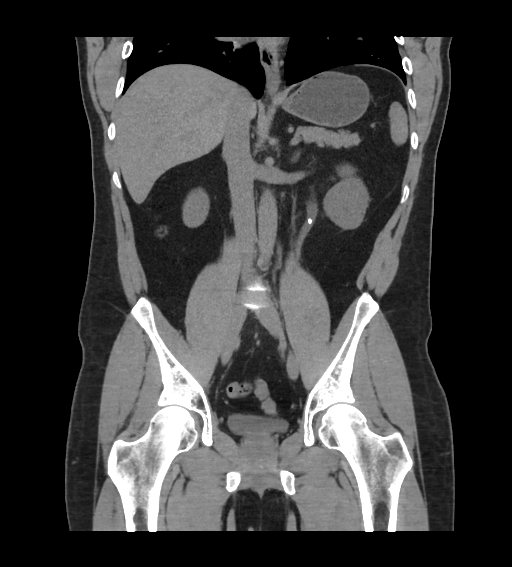

[16 of 46 positions shown; findings below may reference images not displayed]

FINDINGS: Lower chest: Incidental imaging of the lung bases is unremarkable.
No signs of pleural effusion. No evidence of pericardial fluid.

Hepatobiliary: No signs of focal hepatic lesion. Post
cholecystectomy.

Pancreas: Pancreas is normal without signs of peripancreatic
inflammation.

Spleen: Spleen is normal size without visible lesion on noncontrast
imaging.

Adrenals/Urinary Tract: Adrenal glands are normal.

Signs of mild-to-moderate left-sided hydroureteronephrosis with left
UPJ/proximal ureteral calculus measuring 5 x 6 mm. Mild Peri
ureteral stranding. No additional calculus in the left ureter.
Punctate nonobstructing calculus in the interpolar left kidney 2 mm.

Nonobstructing intrarenal calculus upper pole right kidney measuring
2 mm.

Small low-density lesion arising from posterior right kidney, 27
Hounsfield units measuring 11 mm previously 11 mm in 0505.
Characterized as a small cyst on previous renal/abdominal sonogram.

Stomach/Bowel: Appendix is normal.

No signs of bowel obstruction or acute bowel process.

Stool fills the ascending and transverse colon.

Vascular/Lymphatic: Minimal calcified atherosclerotic change in the
abdominal aorta. No signs of aneurysmal dilation. No signs of
adenopathy in the upper abdomen or in the retroperitoneum.

No signs of pelvic lymphadenopathy.

Reproductive: Prostate calcifications, normal sized prostate.
Nonspecific and essentially unremarkable on noncontrast imaging.
Unchanged from prior study.

Other: Small fat containing umbilical hernia.

Musculoskeletal: No acute bone finding or destructive bone process.
IMPRESSION: 1. Signs of mild-to-moderate left-sided hydroureteronephrosis with
left UPJ/proximal ureteral calculus measuring 5 x 6 mm.
2. Nonobstructing intrarenal calculi bilaterally.
3. Normal appendix and changes of cholecystectomy.

## 2022-06-21 ENCOUNTER — Emergency Department (HOSPITAL_COMMUNITY)
Admission: EM | Admit: 2022-06-21 | Discharge: 2022-06-21 | Disposition: A | Discharge status: Home / Self Care | Payer: BC Managed Care – PPO | Attending: Emergency Medicine | Admitting: Emergency Medicine

## 2022-06-21 ENCOUNTER — Emergency Department (HOSPITAL_COMMUNITY): Payer: BC Managed Care – PPO

## 2022-06-21 ENCOUNTER — Other Ambulatory Visit: Payer: Self-pay

## 2022-06-21 DIAGNOSIS — N201 Calculus of ureter: Secondary | ICD-10-CM

## 2022-06-21 DIAGNOSIS — N132 Hydronephrosis with renal and ureteral calculous obstruction: Secondary | ICD-10-CM | POA: Diagnosis not present

## 2022-06-21 DIAGNOSIS — D72829 Elevated white blood cell count, unspecified: Secondary | ICD-10-CM | POA: Insufficient documentation

## 2022-06-21 DIAGNOSIS — R1031 Right lower quadrant pain: Secondary | ICD-10-CM | POA: Diagnosis present

## 2022-06-21 DIAGNOSIS — N23 Unspecified renal colic: Secondary | ICD-10-CM

## 2022-06-21 LAB — BASIC METABOLIC PANEL
Anion gap: 12 (ref 5–15)
BUN: 18 mg/dL (ref 6–20)
CO2: 25 mmol/L (ref 22–32)
Calcium: 9.1 mg/dL (ref 8.9–10.3)
Chloride: 102 mmol/L (ref 98–111)
Creatinine, Ser: 1.43 mg/dL — ABNORMAL HIGH (ref 0.61–1.24)
GFR, Estimated: 59 mL/min — ABNORMAL LOW (ref >60)
Glucose, Bld: 158 mg/dL — ABNORMAL HIGH (ref 70–99)
Potassium: 3.9 mmol/L (ref 3.5–5.1)
Sodium: 139 mmol/L (ref 135–145)

## 2022-06-21 LAB — CBC
HCT: 45.6 % (ref 39.0–52.0)
Hemoglobin: 16.1 g/dL (ref 13.0–17.0)
MCH: 31.1 pg (ref 26.0–34.0)
MCHC: 35.3 g/dL (ref 30.0–36.0)
MCV: 88.2 fL (ref 80.0–100.0)
Platelets: 340 10*3/uL (ref 150–400)
RBC: 5.17 MIL/uL (ref 4.22–5.81)
RDW: 11.6 % (ref 11.5–15.5)
WBC: 15.7 10*3/uL — ABNORMAL HIGH (ref 4.0–10.5)
nRBC: 0 % (ref 0.0–0.2)

## 2022-06-21 LAB — URINALYSIS, ROUTINE W REFLEX MICROSCOPIC
Bacteria, UA: NONE SEEN
Bilirubin Urine: NEGATIVE
Glucose, UA: NEGATIVE mg/dL
Ketones, ur: 5 mg/dL — AB
Leukocytes,Ua: NEGATIVE
Nitrite: NEGATIVE
Protein, ur: NEGATIVE mg/dL
Specific Gravity, Urine: 1.021 (ref 1.005–1.030)
pH: 5 (ref 5.0–8.0)

## 2022-06-21 MED ORDER — ONDANSETRON 4 MG PO TBDP: 4.0000 mg | ORAL_TABLET | Freq: Three times a day (TID) | ORAL | 0 refills | Status: AC | PRN

## 2022-06-21 MED ORDER — NAPROXEN 250 MG PO TABS
375.0000 mg | ORAL_TABLET | Freq: Once | ORAL | Status: AC
  Administered 2022-06-21: 375 mg via ORAL
  Filled 2022-06-21: qty 2

## 2022-06-21 MED ORDER — KETOROLAC TROMETHAMINE 30 MG/ML IJ SOLN
15.0000 mg | Freq: Once | INTRAMUSCULAR | Status: AC
  Administered 2022-06-21: 15 mg via INTRAMUSCULAR

## 2022-06-21 MED ORDER — KETOROLAC TROMETHAMINE 30 MG/ML IJ SOLN
INTRAMUSCULAR | Status: AC
  Filled 2022-06-21: qty 1

## 2022-06-21 MED ORDER — NAPROXEN 375 MG PO TABS: 375.0000 mg | ORAL_TABLET | Freq: Two times a day (BID) | ORAL | 0 refills | Status: DC

## 2022-06-21 NOTE — ED Provider Notes (Signed)
[facility] EMERGENCY DEPARTMENT Provider Note  CSN: [id number] Arrival date & time: 06/21/22 0101  Chief Complaint(s) Flank Pain  HPI Louis Booker is a 51 y.o. male with known history of renal stones and known right renal stone seen recently by urology here for 1 day of intermittent right flank pain that became constant last night around 10 PM and severe.  Patient had associated radiation to the groin as well as nausea and nonbloody nonbilious emesis.  Patient has been taking his Flomax.  States that he took his hydrocodone which did not provide any relief.  This prompted his visit to the ED.  He denies any other physical complaints.  The history is provided by the patient.    Past Medical History Past Medical History:  Diagnosis Date   Kidney stones    There are no problems to display for this patient.  Home Medication(s) Prior to Admission medications   Medication Sig Start Date End Date Taking? Authorizing Provider  naproxen (NAPROSYN) 375 MG tablet Take 1 tablet (375 mg total) by mouth 2 (two) times daily. 06/21/22  Yes Bhavik Cabiness, Amadeo Garnet, MD  ondansetron (ZOFRAN-ODT) 4 MG disintegrating tablet Take 1 tablet (4 mg total) by mouth every 8 (eight) hours as needed for up to 3 days for nausea or vomiting. 06/21/22 06/24/22 Yes Khiree Bukhari, Amadeo Garnet, MD  Tamsulosin HCl (FLOMAX) 0.4 MG CAPS Take 1 capsule (0.4 mg total) by mouth daily. 02/22/12   Palumbo, April, MD                                                                                                                                    Allergies Penicillins  Review of Systems Review of Systems As noted in HPI  Physical Exam Vital Signs  I have reviewed the triage vital signs BP 108/61 (BP Location: Right Arm)   Pulse 62   Temp 98 F (36.7 C)   Resp 14   SpO2 96%   Physical Exam Vitals reviewed.  Constitutional:      General: He is not in acute distress.    Appearance: He is  well-developed. He is not diaphoretic.  HENT:     Head: Normocephalic and atraumatic.     Right Ear: External ear normal.     Left Ear: External ear normal.     Nose: Nose normal.     Mouth/Throat:     Mouth: Mucous membranes are moist.  Eyes:     General: No scleral icterus.    Conjunctiva/sclera: Conjunctivae normal.  Neck:     Trachea: Phonation normal.  Cardiovascular:     Rate and Rhythm: Normal rate and regular rhythm.  Pulmonary:     Effort: Pulmonary effort is normal. No respiratory distress.     Breath sounds: No stridor.  Abdominal:     General: There is no distension.     Tenderness: There is no abdominal tenderness. There  is no right CVA tenderness or left CVA tenderness.  Musculoskeletal:        General: Normal range of motion.     Cervical back: Normal range of motion.  Neurological:     Mental Status: He is alert and oriented to person, place, and time.  Psychiatric:        Behavior: Behavior normal.     ED Results and Treatments Labs (all labs ordered are listed, but only abnormal results are displayed) Labs Reviewed  URINALYSIS, ROUTINE W REFLEX MICROSCOPIC - Abnormal; Notable for the following components:      Result Value   Hgb urine dipstick MODERATE (*)    Ketones, ur 5 (*)    All other components within normal limits  CBC - Abnormal; Notable for the following components:   WBC 15.7 (*)    All other components within normal limits  BASIC METABOLIC PANEL - Abnormal; Notable for the following components:   Glucose, Bld 158 (*)    Creatinine, Ser 1.43 (*)    GFR, Estimated 59 (*)    All other components within normal limits                                                                                                                         EKG  EKG Interpretation  Date/Time:    Ventricular Rate:    PR Interval:    QRS Duration:   QT Interval:    QTC Calculation:   R Axis:     Text Interpretation:         Radiology CT Renal Stone  Study  Result Date: 06/21/2022 CLINICAL DATA:  Right-sided abdominal pain. EXAM: CT ABDOMEN AND PELVIS WITHOUT CONTRAST TECHNIQUE: Multidetector CT imaging of the abdomen and pelvis was performed following the standard protocol without IV contrast. RADIATION DOSE REDUCTION: This exam was performed according to the departmental dose-optimization program which includes automated exposure control, adjustment of the mA and/or kV according to patient size and/or use of iterative reconstruction technique. COMPARISON:  September 02, 2019 FINDINGS: Lower chest: No acute abnormality. Hepatobiliary: No focal liver abnormality is seen. Status post cholecystectomy. No biliary dilatation. Pancreas: Unremarkable. No pancreatic ductal dilatation or surrounding inflammatory changes. Spleen: Normal in size without focal abnormality. Adrenals/Urinary Tract: Adrenal glands are unremarkable. Kidneys are normal in size without focal lesions. A 1 mm nonobstructing renal calculus is seen within the mid left kidney. An additional tiny nonobstructing renal calculus is seen within the mid to upper right kidney. A 3 mm obstructing renal calculus is seen within the mid right ureter, with mild to moderate severity right-sided hydronephrosis and hydroureter. Bladder is unremarkable. Stomach/Bowel: Stomach is within normal limits. Appendix appears normal. No evidence of bowel wall thickening, distention, or inflammatory changes. Vascular/Lymphatic: No significant vascular findings are present. No enlarged abdominal or pelvic lymph nodes. Reproductive: Prostate is unremarkable. Other: No abdominal wall hernia or abnormality. No abdominopelvic ascites. Musculoskeletal: No acute or significant osseous findings. IMPRESSION: 1. 3 mm  obstructing renal calculus within the mid right ureter. 2. Bilateral subcentimeter nonobstructing renal calculi. 3. Evidence of prior cholecystectomy. Electronically Signed   By: Aram Candela M.D.   On: 06/21/2022  01:54    Medications Ordered in ED Medications  ketorolac (TORADOL) 30 MG/ML injection (has no administration in time range)  naproxen (NAPROSYN) tablet 375 mg (has no administration in time range)  ketorolac (TORADOL) 30 MG/ML injection 15 mg (15 mg Intramuscular Given 06/21/22 0128)                                                                                                                                     Procedures Procedures  (including critical care time)  Medical Decision Making / ED Course   Medical Decision Making Amount and/or Complexity of Data Reviewed Labs: ordered. Decision-making details documented in ED Course. Radiology: ordered and independent interpretation performed. Decision-making details documented in ED Course.  Risk Prescription drug management.    Right flank pain with known renal stone.  Presentation is most consistent with renal colic.  We will get labs to rule out infection.  We will get a CT scan to determine size and position of the stone.  CBC with leukocytosis.  No anemia.  Metabolic panel without significant electrolyte derangements.  Patient does have mild renal sufficiency.  UA with hematuria without evidence of infection.  CT stone study notable for 3 mm mid ureter stone with mild to moderate hydronephrosis.  Patient was provided with IM Toradol resulting in significant pain control.  She already has appointment with Dr. Annabell Howells on Monday.      Final Clinical Impression(s) / ED Diagnoses Final diagnoses:  Ureterolithiasis  Ureteral colic   The patient appears reasonably screened and/or stabilized for discharge and I doubt any other medical condition or other [facility] requiring further screening, evaluation, or treatment in the ED at this time. I have discussed the findings, Dx and Tx plan with the patient/family who expressed understanding and agree(s) with the plan. Discharge instructions discussed at length. The patient/family was given  strict return precautions who verbalized understanding of the instructions. No further questions at time of discharge.  Disposition: Discharge  Condition: Good  ED Discharge Orders          Ordered    naproxen (NAPROSYN) 375 MG tablet  2 times daily        06/21/22 0626    ondansetron (ZOFRAN-ODT) 4 MG disintegrating tablet  Every 8 hours PRN        06/21/22 0626             Follow Up: Bjorn Pippin, MD [address] [place] [address] [phone]  Go on 06/23/2022 as scheduled           This chart was dictated using voice recognition software.  Despite best efforts to proofread,  errors can occur which can change the documentation meaning.  Nira Conn, MD 06/21/22 [phone]

## 2022-06-21 NOTE — ED Triage Notes (Signed)
Pt with intermittent right flank pain all day. About 10 pm became constant and severe.  Radiates into right groin.  Known hx of stone on that side.  Pt unable to sit still due to discomfort.

## 2022-06-23 ENCOUNTER — Other Ambulatory Visit: Payer: Self-pay | Admitting: Urology

## 2022-06-23 ENCOUNTER — Encounter (HOSPITAL_BASED_OUTPATIENT_CLINIC_OR_DEPARTMENT_OTHER): Payer: Self-pay | Admitting: Urology

## 2022-06-23 NOTE — Progress Notes (Signed)
Spoke with pt regarding upcoming ESWL procedure 06/26/22. Pt instructed to arrive to [facility] at 1315, NPO after 0715. Reviewed medical history, allergies, and medications. Pt instructed to hold NSAIDs, ASA products, and supplements. Reviewed pre-op instructions and instructed pt to bring blue folder day of. Driver secured (wife). Pt verbalized understanding and states no further questions. Phone number for [facility] given.

## 2022-06-26 ENCOUNTER — Encounter (HOSPITAL_BASED_OUTPATIENT_CLINIC_OR_DEPARTMENT_OTHER)
Admission: RE | Disposition: A | Discharge status: Home / Self Care | Payer: Self-pay | Source: Home / Self Care | Attending: Urology

## 2022-06-26 ENCOUNTER — Encounter (HOSPITAL_BASED_OUTPATIENT_CLINIC_OR_DEPARTMENT_OTHER): Payer: Self-pay | Admitting: Urology

## 2022-06-26 ENCOUNTER — Ambulatory Visit (HOSPITAL_BASED_OUTPATIENT_CLINIC_OR_DEPARTMENT_OTHER)
Admission: RE | Admit: 2022-06-26 | Discharge: 2022-06-26 | Disposition: A | Discharge status: Home / Self Care | Payer: BC Managed Care – PPO | Attending: Urology | Admitting: Urology

## 2022-06-26 ENCOUNTER — Other Ambulatory Visit: Payer: Self-pay

## 2022-06-26 ENCOUNTER — Ambulatory Visit (HOSPITAL_COMMUNITY): Payer: BC Managed Care – PPO

## 2022-06-26 DIAGNOSIS — N201 Calculus of ureter: Secondary | ICD-10-CM | POA: Insufficient documentation

## 2022-06-26 HISTORY — PX: EXTRACORPOREAL SHOCK WAVE LITHOTRIPSY: SHX1557

## 2022-06-26 SURGERY — LITHOTRIPSY, ESWL
Anesthesia: LOCAL | Laterality: Right

## 2022-06-26 MED ORDER — CIPROFLOXACIN HCL 500 MG PO TABS
ORAL_TABLET | ORAL | Status: AC
  Filled 2022-06-26: qty 1

## 2022-06-26 MED ORDER — DIPHENHYDRAMINE HCL 25 MG PO CAPS
ORAL_CAPSULE | ORAL | Status: AC
  Filled 2022-06-26: qty 1

## 2022-06-26 MED ORDER — HYDROCODONE-ACETAMINOPHEN 5-325 MG PO TABS
ORAL_TABLET | ORAL | Status: AC
  Filled 2022-06-26: qty 1

## 2022-06-26 MED ORDER — ONDANSETRON HCL 4 MG PO TABS: 4.0000 mg | ORAL_TABLET | Freq: Every day | ORAL | 1 refills | Status: DC | PRN

## 2022-06-26 MED ORDER — SODIUM CHLORIDE 0.9 % IV SOLN: INTRAVENOUS | Status: DC

## 2022-06-26 MED ORDER — HYDROCODONE-ACETAMINOPHEN 5-325 MG PO TABS
1.0000 | ORAL_TABLET | Freq: Once | ORAL | Status: AC
  Administered 2022-06-26: 1 via ORAL

## 2022-06-26 MED ORDER — DIPHENHYDRAMINE HCL 25 MG PO CAPS
25.0000 mg | ORAL_CAPSULE | ORAL | Status: AC
  Administered 2022-06-26: 25 mg via ORAL

## 2022-06-26 MED ORDER — DIAZEPAM 5 MG PO TABS
ORAL_TABLET | ORAL | Status: AC
  Filled 2022-06-26: qty 2

## 2022-06-26 MED ORDER — DIAZEPAM 5 MG PO TABS
10.0000 mg | ORAL_TABLET | ORAL | Status: AC
  Administered 2022-06-26: 10 mg via ORAL

## 2022-06-26 MED ORDER — CIPROFLOXACIN HCL 500 MG PO TABS
500.0000 mg | ORAL_TABLET | ORAL | Status: AC
  Administered 2022-06-26: 500 mg via ORAL

## 2022-06-26 MED ORDER — HYDROCODONE-ACETAMINOPHEN 5-325 MG PO TABS: 1.0000 | ORAL_TABLET | Freq: Four times a day (QID) | ORAL | 0 refills | Status: DC | PRN

## 2022-06-26 NOTE — Op Note (Signed)
ESWL Operative Note  Treating Physician: Rhoderick Moody, MD  Pre-op diagnosis: 4 mm right mid-ureteral stone  Post-op diagnosis: Same   Procedure: RIGHT ESWL  See Rojelio Brenner OP note scanned into chart. Also because of the size, density, location and other factors that cannot be anticipated I feel this will likely be a staged procedure. This fact supersedes any indication in the scanned [place] [facility] operative note to the contrary

## 2022-06-26 NOTE — H&P (Signed)
See scanned [facility] documents for H&P.   

## 2022-06-26 NOTE — Progress Notes (Addendum)
06/26/2022 5:11 PM Dr. Liliane Shi called to evaluate pt. Post procedure HR sustaining via monitor 40-58. Pt. States his HR is normally high 50's to 60's. BP elevated but stable. Pt. Denies dizziness or other symptoms. MD at bedside. No new orders. No additional monitoring needed. Questions and concerns addressed by family at bedside with MD. Verbal order Dr. Liliane Shi ok to d/c patient home. Continued to monitor patient for additional time per family request prior to discharge.  Chelise Hanger, Blanchard Kelch

## 2022-06-27 ENCOUNTER — Encounter (HOSPITAL_BASED_OUTPATIENT_CLINIC_OR_DEPARTMENT_OTHER): Payer: Self-pay | Admitting: Urology

## 2023-05-01 ENCOUNTER — Ambulatory Visit (AMBULATORY_SURGERY_CENTER): Payer: No Typology Code available for payment source

## 2023-05-01 VITALS — Ht 76.0 in | Wt 216.0 lb

## 2023-05-01 DIAGNOSIS — Z1211 Encounter for screening for malignant neoplasm of colon: Secondary | ICD-10-CM

## 2023-05-01 MED ORDER — NA SULFATE-K SULFATE-MG SULF 17.5-3.13-1.6 GM/177ML PO SOLN: 1.0000 | Freq: Once | ORAL | 0 refills | Status: AC

## 2023-05-01 NOTE — Progress Notes (Signed)

## 2023-05-07 ENCOUNTER — Encounter: Payer: Self-pay | Admitting: Internal Medicine

## 2023-05-22 ENCOUNTER — Encounter: Payer: Self-pay | Admitting: Internal Medicine

## 2023-05-22 ENCOUNTER — Ambulatory Visit: Payer: No Typology Code available for payment source | Admitting: Internal Medicine

## 2023-05-22 VITALS — BP 138/90 | HR 54 | Temp 97.5°F | Resp 14 | Ht 76.0 in | Wt 216.0 lb

## 2023-05-22 DIAGNOSIS — Z1211 Encounter for screening for malignant neoplasm of colon: Secondary | ICD-10-CM | POA: Diagnosis present

## 2023-05-22 DIAGNOSIS — D122 Benign neoplasm of ascending colon: Secondary | ICD-10-CM

## 2023-05-22 DIAGNOSIS — D123 Benign neoplasm of transverse colon: Secondary | ICD-10-CM

## 2023-05-22 MED ORDER — SODIUM CHLORIDE 0.9 % IV SOLN: 500.0000 mL | Freq: Once | INTRAVENOUS | Status: DC

## 2023-05-22 NOTE — Progress Notes (Signed)
Report to PACU, RN, vss, BBS= Clear.  

## 2023-05-22 NOTE — Progress Notes (Signed)
Called to room to assist during endoscopic procedure.  Patient ID and intended procedure confirmed with present staff. Received instructions for my participation in the procedure from the performing physician.  

## 2023-05-22 NOTE — Op Note (Signed)
[facility] Patient Name: Louis Booker Procedure Date: 05/22/2023 10:41 AM MRN: [id number] Endoscopist: Wilhemina Bonito. Marina Goodell , MD, [id number] Age: 52 Referring MD:  Date of Birth: May 08, 1971 Gender: Male Account #: [id number] Procedure:                Colonoscopy with cold snare polypectomy x 1; with                            biopsy polypectomy x 1 Indications:              Screening for colorectal malignant neoplasm Medicines:                Monitored Anesthesia Care Procedure:                Pre-Anesthesia Assessment:                           - Prior to the procedure, a History and Physical                            was performed, and patient medications and                            allergies were reviewed. The patient's tolerance of                            previous anesthesia was also reviewed. The risks                            and benefits of the procedure and the sedation                            options and risks were discussed with the patient.                            All questions were answered, and informed consent                            was obtained. Prior Anticoagulants: The patient has                            taken no anticoagulant or antiplatelet agents. ASA                            Grade Assessment: II - A patient with mild systemic                            disease. After reviewing the risks and benefits,                            the patient was deemed in satisfactory condition to                            undergo the procedure.  After obtaining informed consent, the colonoscope                            was passed under direct vision. Throughout the                            procedure, the patient's blood pressure, pulse, and                            oxygen saturations were monitored continuously. The                            Olympus Scope SN: [id number] was introduced through                            the  anus and advanced to the the cecum, identified                            by appendiceal orifice and ileocecal valve. The                            ileocecal valve, appendiceal orifice, and rectum                            were photographed. The quality of the bowel                            preparation was excellent. The colonoscopy was                            performed without difficulty. The patient tolerated                            the procedure well. The bowel preparation used was                            SUPREP via split dose instruction. Scope In: 10:51:37 AM Scope Out: 11:06:17 AM Scope Withdrawal Time: 0 hours 12 minutes 32 seconds  Total Procedure Duration: 0 hours 14 minutes 40 seconds  Findings:                 A 3 mm polyp was found in the ascending colon. The                            polyp was removed with a cold snare. Resection and                            retrieval were complete.                           A 1 mm polyp was found in the transverse colon. The                            polyp was  removed with a jumbo cold forceps.                            Resection and retrieval were complete.                           The exam was otherwise without abnormality on                            direct and retroflexion views. Complications:            No immediate complications. Estimated blood loss:                            None. Estimated Blood Loss:     Estimated blood loss: none. Impression:               - One 3 mm polyp in the ascending colon, removed                            with a cold snare. Resected and retrieved.                           - One 1 mm polyp in the transverse colon, removed                            with a jumbo cold forceps. Resected and retrieved.                           - The examination was otherwise normal on direct                            and retroflexion views. Recommendation:           - Repeat colonoscopy in 7-10 years  for surveillance.                           - Patient has a contact number available for                            emergencies. The signs and symptoms of potential                            delayed complications were discussed with the                            patient. Return to normal activities tomorrow.                            Written discharge instructions were provided to the                            patient.                           - Resume previous diet.                           -  Continue present medications.                           - Await pathology results. Wilhemina Bonito. Marina Goodell, MD 05/22/2023 11:13:25 AM This report has been signed electronically.

## 2023-05-22 NOTE — Patient Instructions (Signed)

## 2023-05-22 NOTE — Progress Notes (Signed)
HISTORY OF PRESENT ILLNESS:  Louis Booker is a 52 y.o. male who presents for routine screening colonoscopy.  No complaints  REVIEW OF SYSTEMS:  All non-GI ROS negative except for  Past Medical History:  Diagnosis Date   Kidney stones    PONV (postoperative nausea and vomiting)     Past Surgical History:  Procedure Laterality Date   CHOLECYSTECTOMY     EXTRACORPOREAL SHOCK WAVE LITHOTRIPSY Right 06/26/2022   Procedure: RIGHT EXTRACORPOREAL SHOCK WAVE LITHOTRIPSY (ESWL);  Surgeon: Rene Paci, MD;  Location: [facility];  Service: Urology;  Laterality: Right;   LITHOTRIPSY     Several times, last 2/21    Social History Jamorian Hatter  reports that he has never smoked. He has never used smokeless tobacco. He reports current alcohol use of about 2.0 standard drinks of alcohol per week. He reports that he does not use drugs.  family history is not on file.  Allergies  Allergen Reactions   Penicillins Rash       PHYSICAL EXAMINATION: Vital signs: BP 128/84   Pulse 63   Temp (!) 97.5 F (36.4 C)   Resp 18   Ht 6\' 4"  (1.93 m)   Wt 216 lb (98 kg)   SpO2 95%   BMI 26.29 kg/m  General: Well-developed, well-nourished, no acute distress HEENT: Sclerae are anicteric, conjunctiva pink. Oral mucosa intact Lungs: Clear Heart: Regular Abdomen: soft, nontender, nondistended, no obvious ascites, no peritoneal signs, normal bowel sounds. No organomegaly. Extremities: No edema Psychiatric: alert and oriented x3. Cooperative     ASSESSMENT:   Colon cancer screening  PLAN:  Screening colonoscopy

## 2023-05-22 NOTE — Progress Notes (Signed)
Pt's states no medical or surgical changes since previsit or office visit. 

## 2023-05-25 ENCOUNTER — Telehealth: Payer: Self-pay

## 2023-05-25 NOTE — Telephone Encounter (Signed)
Follow up call to pt, lm for pt to call if having any difficulty with normal activities or eating and drinking.  Also to call if any other questions or concerns.  

## 2023-05-27 ENCOUNTER — Encounter: Payer: Self-pay | Admitting: Internal Medicine

## 2023-05-27 LAB — SURGICAL PATHOLOGY
